# Patient Record
Sex: Female | Born: 1950 | Hispanic: Yes | Marital: Married | Smoking: Never smoker
Health system: Southern US, Community
[De-identification: ages and names within clinical notes are randomized; demographics above are authoritative.]

## PROBLEM LIST (undated history)

## (undated) DIAGNOSIS — F329 Major depressive disorder, single episode, unspecified: Secondary | ICD-10-CM

## (undated) DIAGNOSIS — F32A Depression, unspecified: Secondary | ICD-10-CM

## (undated) DIAGNOSIS — E7800 Pure hypercholesterolemia, unspecified: Secondary | ICD-10-CM

## (undated) DIAGNOSIS — E119 Type 2 diabetes mellitus without complications: Secondary | ICD-10-CM

## (undated) DIAGNOSIS — R42 Dizziness and giddiness: Secondary | ICD-10-CM

## (undated) DIAGNOSIS — E0590 Thyrotoxicosis, unspecified without thyrotoxic crisis or storm: Secondary | ICD-10-CM

## (undated) DIAGNOSIS — R7302 Impaired glucose tolerance (oral): Secondary | ICD-10-CM

## (undated) DIAGNOSIS — I10 Essential (primary) hypertension: Secondary | ICD-10-CM

## (undated) HISTORY — PX: FRACTURE SURGERY: SHX138

## (undated) HISTORY — PX: HERNIA REPAIR: SHX51

---

## 2010-01-14 ENCOUNTER — Ambulatory Visit: Payer: Self-pay | Admitting: Family Medicine

## 2013-10-15 ENCOUNTER — Ambulatory Visit: Payer: Self-pay | Admitting: Family Medicine

## 2015-12-16 ENCOUNTER — Other Ambulatory Visit: Payer: Self-pay | Admitting: Nurse Practitioner

## 2015-12-16 DIAGNOSIS — Z1231 Encounter for screening mammogram for malignant neoplasm of breast: Secondary | ICD-10-CM

## 2015-12-24 ENCOUNTER — Ambulatory Visit
Admission: RE | Admit: 2015-12-24 | Discharge: 2015-12-24 | Disposition: A | Discharge status: Home / Self Care | Payer: BLUE CROSS/BLUE SHIELD | Source: Ambulatory Visit | Attending: Nurse Practitioner | Admitting: Nurse Practitioner

## 2015-12-24 DIAGNOSIS — Z1231 Encounter for screening mammogram for malignant neoplasm of breast: Secondary | ICD-10-CM | POA: Diagnosis present

## 2016-04-05 ENCOUNTER — Encounter: Payer: Self-pay | Admitting: *Deleted

## 2016-04-06 ENCOUNTER — Ambulatory Visit: Payer: BLUE CROSS/BLUE SHIELD | Admitting: Anesthesiology

## 2016-04-06 ENCOUNTER — Encounter: Payer: Self-pay | Admitting: *Deleted

## 2016-04-06 ENCOUNTER — Ambulatory Visit
Admission: RE | Admit: 2016-04-06 | Discharge: 2016-04-06 | Disposition: A | Discharge status: Home / Self Care | Payer: BLUE CROSS/BLUE SHIELD | Source: Ambulatory Visit | Attending: Gastroenterology | Admitting: Gastroenterology

## 2016-04-06 ENCOUNTER — Encounter
Admission: RE | Disposition: A | Discharge status: Home / Self Care | Payer: Self-pay | Source: Ambulatory Visit | Attending: Gastroenterology

## 2016-04-06 DIAGNOSIS — I10 Essential (primary) hypertension: Secondary | ICD-10-CM | POA: Diagnosis not present

## 2016-04-06 DIAGNOSIS — Z1211 Encounter for screening for malignant neoplasm of colon: Secondary | ICD-10-CM | POA: Diagnosis present

## 2016-04-06 DIAGNOSIS — K644 Residual hemorrhoidal skin tags: Secondary | ICD-10-CM | POA: Diagnosis not present

## 2016-04-06 DIAGNOSIS — Z7984 Long term (current) use of oral hypoglycemic drugs: Secondary | ICD-10-CM | POA: Diagnosis not present

## 2016-04-06 DIAGNOSIS — Z7982 Long term (current) use of aspirin: Secondary | ICD-10-CM | POA: Diagnosis not present

## 2016-04-06 DIAGNOSIS — E7800 Pure hypercholesterolemia, unspecified: Secondary | ICD-10-CM | POA: Diagnosis not present

## 2016-04-06 DIAGNOSIS — E0590 Thyrotoxicosis, unspecified without thyrotoxic crisis or storm: Secondary | ICD-10-CM | POA: Diagnosis not present

## 2016-04-06 DIAGNOSIS — K640 First degree hemorrhoids: Secondary | ICD-10-CM | POA: Diagnosis not present

## 2016-04-06 DIAGNOSIS — E119 Type 2 diabetes mellitus without complications: Secondary | ICD-10-CM | POA: Diagnosis not present

## 2016-04-06 HISTORY — DX: Essential (primary) hypertension: I10

## 2016-04-06 HISTORY — DX: Thyrotoxicosis, unspecified without thyrotoxic crisis or storm: E05.90

## 2016-04-06 HISTORY — DX: Depression, unspecified: F32.A

## 2016-04-06 HISTORY — PX: COLONOSCOPY WITH PROPOFOL: SHX5780

## 2016-04-06 HISTORY — DX: Type 2 diabetes mellitus without complications: E11.9

## 2016-04-06 HISTORY — DX: Dizziness and giddiness: R42

## 2016-04-06 HISTORY — DX: Impaired glucose tolerance (oral): R73.02

## 2016-04-06 HISTORY — DX: Major depressive disorder, single episode, unspecified: F32.9

## 2016-04-06 HISTORY — DX: Pure hypercholesterolemia, unspecified: E78.00

## 2016-04-06 LAB — GLUCOSE, CAPILLARY: Glucose-Capillary: 90 mg/dL (ref 65–99)

## 2016-04-06 SURGERY — COLONOSCOPY WITH PROPOFOL
Anesthesia: General

## 2016-04-06 MED ORDER — PROPOFOL 500 MG/50ML IV EMUL
INTRAVENOUS | Status: DC | PRN
  Administered 2016-04-06: 100 ug/kg/min via INTRAVENOUS

## 2016-04-06 MED ORDER — MIDAZOLAM HCL 2 MG/2ML IJ SOLN
INTRAMUSCULAR | Status: DC | PRN
  Administered 2016-04-06: 1 mg via INTRAVENOUS

## 2016-04-06 MED ORDER — PROPOFOL 10 MG/ML IV BOLUS
INTRAVENOUS | Status: DC | PRN
  Administered 2016-04-06: 80 mg via INTRAVENOUS

## 2016-04-06 MED ORDER — SODIUM CHLORIDE 0.9 % IV SOLN
INTRAVENOUS | Status: DC
  Administered 2016-04-06: 10:00:00 via INTRAVENOUS

## 2016-04-06 NOTE — Anesthesia Postprocedure Evaluation (Signed)
Anesthesia Post Note  Patient: Laura Middleton  Procedure(s) Performed: Procedure(s) (LRB): COLONOSCOPY WITH PROPOFOL (N/A)  Patient location during evaluation: Endoscopy Anesthesia Type: General Level of consciousness: awake and alert Pain management: pain level controlled Vital Signs Assessment: post-procedure vital signs reviewed and stable Respiratory status: spontaneous breathing, nonlabored ventilation and respiratory function stable Cardiovascular status: blood pressure returned to baseline and stable Postop Assessment: no signs of nausea or vomiting Anesthetic complications: no    Last Vitals:  Vitals:   04/06/16 0833 04/06/16 1019  BP: 127/76 94/61  Pulse: 65   Resp: 16 16  Temp: 36.4 C     Last Pain:  Vitals:   04/06/16 0833  TempSrc: Tympanic                 Cortney Mckinney

## 2016-04-06 NOTE — Transfer of Care (Signed)
Immediate Anesthesia Transfer of Care Note  Patient: Laura Middleton  Procedure(s) Performed: Procedure(s): COLONOSCOPY WITH PROPOFOL (N/A)  Patient Location: PACU and Endoscopy Unit  Anesthesia Type:General  Level of Consciousness: patient cooperative and lethargic  Airway & Oxygen Therapy: Patient Spontanous Breathing and Patient connected to nasal cannula oxygen  Post-op Assessment: Report given to RN and Post -op Vital signs reviewed and stable  Post vital signs: Reviewed  Last Vitals:  Vitals:   04/06/16 0833 04/06/16 1019  BP: 127/76 94/61  Pulse: 65   Resp: 16 16  Temp: 36.4 C     Last Pain:  Vitals:   04/06/16 0833  TempSrc: Tympanic         Complications: No apparent anesthesia complications

## 2016-04-06 NOTE — H&P (Signed)
Outpatient short stay form Pre-procedure 04/06/2016 9:41 AM Christena DeemMartin U Malissa Slay MD  Primary Physician: Lenon OmsSarah Gauger NP  Reason for visit:  Colon screening  History of present illness:  Patient is a 65 year old female presenting today for colonoscopy. She tolerated her prep well. She does take 81 mg aspirin but is held that for the past 2 days. She does not take any other aspirin products or blood thinning agents.    Current Facility-Administered Medications:  .  0.9 %  sodium chloride infusion, , Intravenous, Continuous, Christena DeemMartin U Jr Milliron, MD  Prescriptions Prior to Admission  Medication Sig Dispense Refill Last Dose  . aspirin EC 81 MG tablet Take 81 mg by mouth daily.   04/04/2016  . calcium-vitamin D 250-100 MG-UNIT tablet Take 1 tablet by mouth 2 (two) times daily.   Past Week at Unknown time  . enalapril (VASOTEC) 10 MG tablet Take 10 mg by mouth daily.   Past Week at Unknown time  . metFORMIN (GLUCOPHAGE-XR) 500 MG 24 hr tablet Take 500 mg by mouth daily after supper.   Past Week at Unknown time  . simvastatin (ZOCOR) 10 MG tablet Take 10 mg by mouth daily.   Past Week at Unknown time     No Known Allergies   Past Medical History:  Diagnosis Date  . Depression   . Diabetes mellitus without complication (HCC)   . Dizziness   . High cholesterol   . Hypertension   . Hyperthyroidism   . IGT (impaired glucose tolerance)     Review of systems:      Physical Exam    Heart and lungs: Regular rate and rhythm without rub or gallop, lungs are bilaterally clear.    HEENT: Normocephalic atraumatic eyes are anicteric    Other:     Pertinant exam for procedure: Soft nontender nondistended bowel sounds positive normoactive.    Planned proceedures: Colonoscopy and indicated procedures. I have discussed the risks benefits and complications of procedures to include not limited to bleeding, infection, perforation and the risk of sedation and the patient wishes to  proceed.    Christena DeemMartin U Alexzander Dolinger, MD Gastroenterology 04/06/2016  9:41 AM

## 2016-04-06 NOTE — Anesthesia Preprocedure Evaluation (Addendum)
Anesthesia Evaluation  Patient identified by MRN, date of birth, ID band Patient awake    Reviewed: Allergy & Precautions, NPO status , Patient's Chart, lab work & pertinent test results  History of Anesthesia Complications Negative for: history of anesthetic complications  Airway Mallampati: II  TM Distance: >3 FB Neck ROM: Full    Dental  (+) Poor Dentition, Loose,    Pulmonary neg pulmonary ROS, neg sleep apnea, neg COPD,    breath sounds clear to auscultation- rhonchi (-) wheezing      Cardiovascular hypertension, Pt. on medications (-) CAD and (-) Past MI  Rhythm:Regular Rate:Normal - Systolic murmurs and - Diastolic murmurs    Neuro/Psych Depression negative neurological ROS     GI/Hepatic negative GI ROS, Neg liver ROS,   Endo/Other  diabetes, Type 2, Oral Hypoglycemic Agents  Renal/GU negative Renal ROS     Musculoskeletal negative musculoskeletal ROS (+)   Abdominal (+) + obese,   Peds  Hematology negative hematology ROS (+)   Anesthesia Other Findings Past Medical History: No date: Depression No date: Diabetes mellitus without complication (HCC) No date: Dizziness No date: High cholesterol No date: Hypertension No date: Hyperthyroidism No date: IGT (impaired glucose tolerance)   Reproductive/Obstetrics                            Anesthesia Physical Anesthesia Plan  ASA: II  Anesthesia Plan: General   Post-op Pain Management:    Induction: Intravenous  Airway Management Planned: Natural Airway  Additional Equipment:   Intra-op Plan:   Post-operative Plan:   Informed Consent: I have reviewed the patients History and Physical, chart, labs and discussed the procedure including the risks, benefits and alternatives for the proposed anesthesia with the patient or authorized representative who has indicated his/her understanding and acceptance.   Dental advisory  given  Plan Discussed with: CRNA and Anesthesiologist  Anesthesia Plan Comments:         Anesthesia Quick Evaluation

## 2016-04-06 NOTE — Op Note (Signed)
[facility] Gastroenterology Patient Name: Laura Middleton Procedure Date: 04/06/2016 9:51 AM MRN: [id number] Account #: [id number] Date of Birth: 09/11/1950 Admit Type: Outpatient Age: [age] Room[facility] ENDO ROOM 3 Gender: Female Note Status: Finalized Procedure:            Colonoscopy Indications:          Screening for colorectal malignant neoplasm Providers:            Christena DeemMartin U. Millie Shorb, MD Referring MD:         No Local Md, MD (Referring MD) Medicines:            Monitored Anesthesia Care Complications:        No immediate complications. Procedure:            Pre-Anesthesia Assessment:                       - ASA Grade Assessment: II - A patient with mild                        systemic disease.                       After obtaining informed consent, the colonoscope was                        passed under direct vision. Throughout the procedure,                        the patient's blood pressure, pulse, and oxygen                        saturations were monitored continuously. The                        Colonoscope was introduced through the anus and                        advanced to the the cecum, identified by appendiceal                        orifice and ileocecal valve. The quality of the bowel                        preparation was good. The colonoscopy was performed                        without difficulty. The patient tolerated the procedure                        well. Findings:      The colon (entire examined portion) appeared normal.      Non-bleeding internal hemorrhoids were found during retroflexion. The       hemorrhoids were small and Grade I (internal hemorrhoids that do not       prolapse).      The perianal exam findings include skin tags. Impression:           - The entire examined colon is normal.                       - Non-bleeding internal hemorrhoids.                       -  Perianal skin tags found on perianal exam.                - No specimens collected. Recommendation:       - Repeat colonoscopy in 10 years for screening purposes. Procedure Code(s):    --- Professional ---                       [id number], Colonoscopy, flexible; diagnostic, including                        collection of specimen(s) by brushing or washing, when                        performed (separate procedure) Diagnosis Code(s):    --- Professional ---                       Z12.11, Encounter for screening for malignant neoplasm                        of colon                       K64.0, First degree hemorrhoids                       K64.4, Residual hemorrhoidal skin tags CPT copyright 2016 [employer]. All rights reserved. The codes documented in this report are preliminary and upon coder review may  be revised to meet current compliance requirements. Christena DeemMartin U Nuri Branca, MD 04/06/2016 10:19:09 AM This report has been signed electronically. Number of Addenda: 0 Note Initiated On: 04/06/2016 9:51 AM Scope Withdrawal Time: 0 hours 7 minutes 12 seconds  Total Procedure Duration: 0 hours 14 minutes 12 seconds       [facility]

## 2016-04-07 ENCOUNTER — Encounter: Payer: Self-pay | Admitting: Gastroenterology

## 2016-12-01 ENCOUNTER — Other Ambulatory Visit: Payer: Self-pay | Admitting: Nurse Practitioner

## 2016-12-01 DIAGNOSIS — Z1231 Encounter for screening mammogram for malignant neoplasm of breast: Secondary | ICD-10-CM

## 2017-01-04 ENCOUNTER — Encounter: Payer: Self-pay | Admitting: Radiology

## 2017-01-04 ENCOUNTER — Ambulatory Visit
Admission: RE | Admit: 2017-01-04 | Discharge: 2017-01-04 | Disposition: A | Discharge status: Home / Self Care | Payer: BLUE CROSS/BLUE SHIELD | Source: Ambulatory Visit | Attending: Nurse Practitioner | Admitting: Nurse Practitioner

## 2017-01-04 DIAGNOSIS — Z1231 Encounter for screening mammogram for malignant neoplasm of breast: Secondary | ICD-10-CM

## 2018-01-11 ENCOUNTER — Other Ambulatory Visit: Payer: Self-pay | Admitting: Nurse Practitioner

## 2018-01-11 DIAGNOSIS — Z1231 Encounter for screening mammogram for malignant neoplasm of breast: Secondary | ICD-10-CM

## 2018-01-24 ENCOUNTER — Ambulatory Visit
Admission: RE | Admit: 2018-01-24 | Discharge: 2018-01-24 | Disposition: A | Discharge status: Home / Self Care | Payer: BLUE CROSS/BLUE SHIELD | Source: Ambulatory Visit | Attending: Nurse Practitioner | Admitting: Nurse Practitioner

## 2018-01-24 ENCOUNTER — Encounter (INDEPENDENT_AMBULATORY_CARE_PROVIDER_SITE_OTHER): Payer: Self-pay

## 2018-01-24 DIAGNOSIS — Z1231 Encounter for screening mammogram for malignant neoplasm of breast: Secondary | ICD-10-CM | POA: Diagnosis present

## 2018-10-24 ENCOUNTER — Ambulatory Visit
Admission: EM | Admit: 2018-10-24 | Discharge: 2018-10-24 | Disposition: A | Discharge status: Home / Self Care | Payer: BLUE CROSS/BLUE SHIELD | Attending: Family Medicine | Admitting: Family Medicine

## 2018-10-24 ENCOUNTER — Other Ambulatory Visit: Payer: Self-pay

## 2018-10-24 DIAGNOSIS — R51 Headache: Secondary | ICD-10-CM

## 2018-10-24 DIAGNOSIS — J029 Acute pharyngitis, unspecified: Secondary | ICD-10-CM

## 2018-10-24 DIAGNOSIS — R05 Cough: Secondary | ICD-10-CM

## 2018-10-24 DIAGNOSIS — B9789 Other viral agents as the cause of diseases classified elsewhere: Secondary | ICD-10-CM

## 2018-10-24 DIAGNOSIS — J069 Acute upper respiratory infection, unspecified: Secondary | ICD-10-CM | POA: Diagnosis not present

## 2018-10-24 LAB — RAPID INFLUENZA A&B ANTIGENS (ARMC ONLY): INFLUENZA A (ARMC): NEGATIVE

## 2018-10-24 LAB — RAPID INFLUENZA A&B ANTIGENS: Influenza B (ARMC): NEGATIVE

## 2018-10-24 MED ORDER — NAPROXEN 500 MG PO TABS: 500.0000 mg | ORAL_TABLET | Freq: Two times a day (BID) | ORAL | 0 refills | Status: DC | PRN

## 2018-10-24 MED ORDER — NAPROXEN 500 MG PO TABS: 500.0000 mg | ORAL_TABLET | Freq: Two times a day (BID) | ORAL | 0 refills | Status: AC | PRN

## 2018-10-24 MED ORDER — BENZONATATE 200 MG PO CAPS: 200.0000 mg | ORAL_CAPSULE | Freq: Three times a day (TID) | ORAL | 0 refills | Status: DC | PRN

## 2018-10-24 MED ORDER — BENZONATATE 200 MG PO CAPS: 200.0000 mg | ORAL_CAPSULE | Freq: Three times a day (TID) | ORAL | 0 refills | Status: AC | PRN
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
## 2018-10-24 NOTE — ED Triage Notes (Signed)
Patient granddaughter translates that patient has been experiencing a cough since yesterday with congestion.

## 2018-10-24 NOTE — Discharge Instructions (Signed)
Flu negative.  This is viral.  Medications as prescribed.  Take care  Dr. Adriana Simas

## 2018-12-19 ENCOUNTER — Other Ambulatory Visit: Payer: Self-pay | Admitting: Nurse Practitioner

## 2018-12-19 DIAGNOSIS — Z1231 Encounter for screening mammogram for malignant neoplasm of breast: Secondary | ICD-10-CM

## 2019-12-17 ENCOUNTER — Other Ambulatory Visit: Payer: Self-pay | Admitting: Nurse Practitioner

## 2019-12-17 DIAGNOSIS — Z1231 Encounter for screening mammogram for malignant neoplasm of breast: Secondary | ICD-10-CM

## 2020-01-20 ENCOUNTER — Ambulatory Visit
Admission: RE | Admit: 2020-01-20 | Discharge: 2020-01-20 | Disposition: A | Discharge status: Home / Self Care | Payer: BLUE CROSS/BLUE SHIELD | Source: Ambulatory Visit | Attending: Nurse Practitioner | Admitting: Nurse Practitioner

## 2020-01-20 ENCOUNTER — Other Ambulatory Visit: Payer: Self-pay

## 2020-01-20 DIAGNOSIS — Z1231 Encounter for screening mammogram for malignant neoplasm of breast: Secondary | ICD-10-CM

## 2021-04-21 IMAGING — MG DIGITAL SCREENING BILAT W/ TOMO W/ CAD
8 series · 8 of 24 positions shown · non-contrast
Comparison: Previous exam(s).

CLINICAL DATA: Screening.

EXAM:
DIGITAL SCREENING BILATERAL MAMMOGRAM WITH TOMO AND CAD

[L MLO synth-2D]
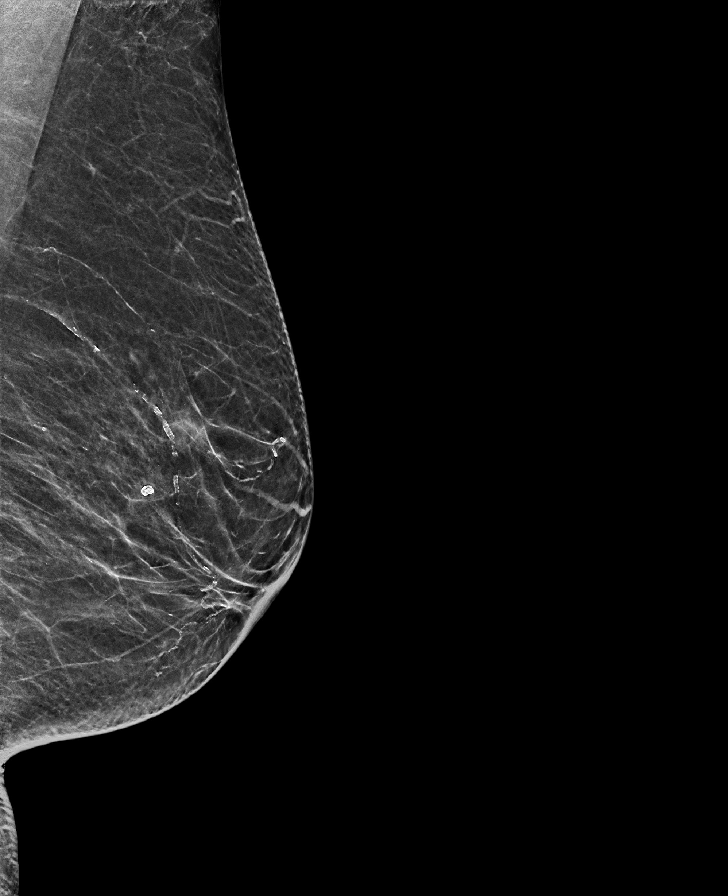

[L CC synth-2D]
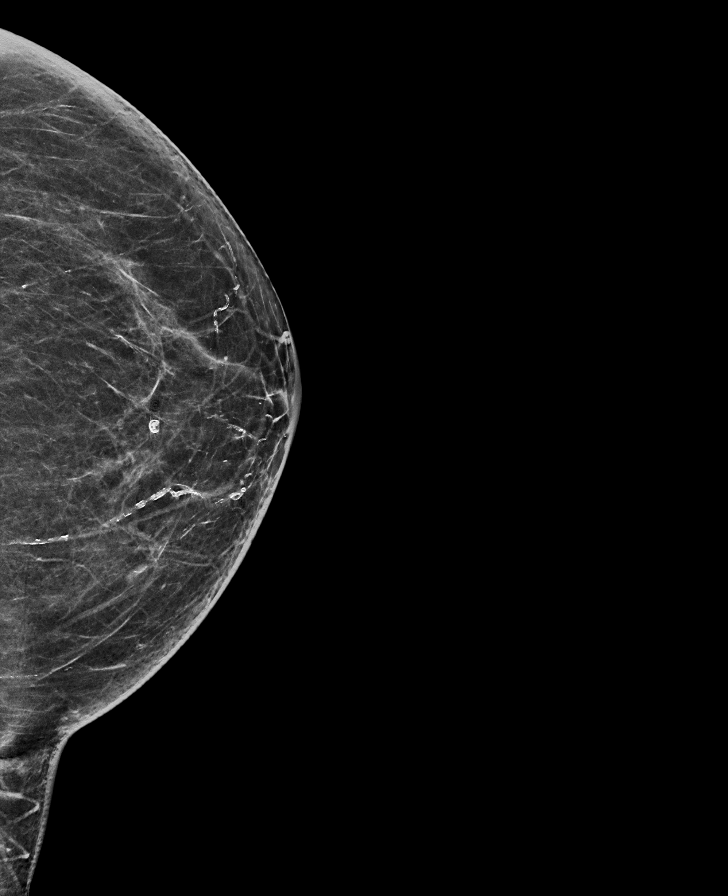

[R CC synth-2D]
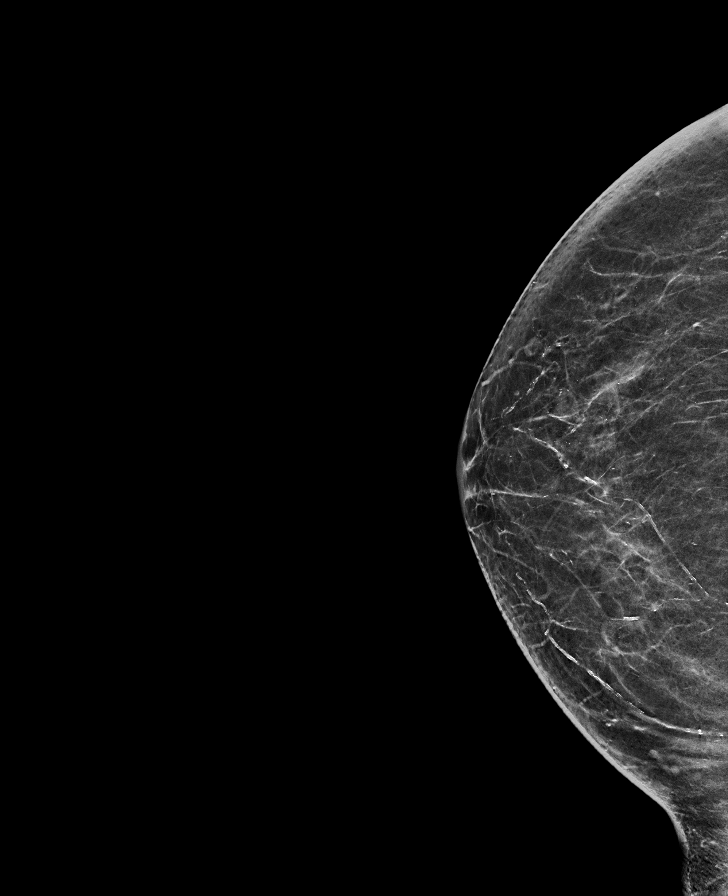

[R MLO synth-2D]
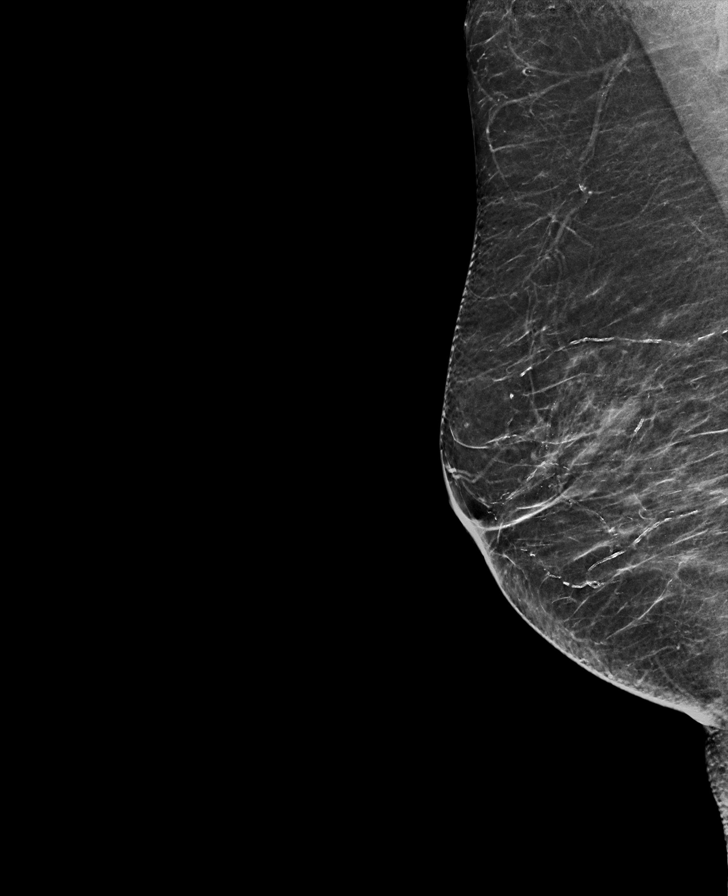

[L CC tomo · tomo slice 33/64.0]
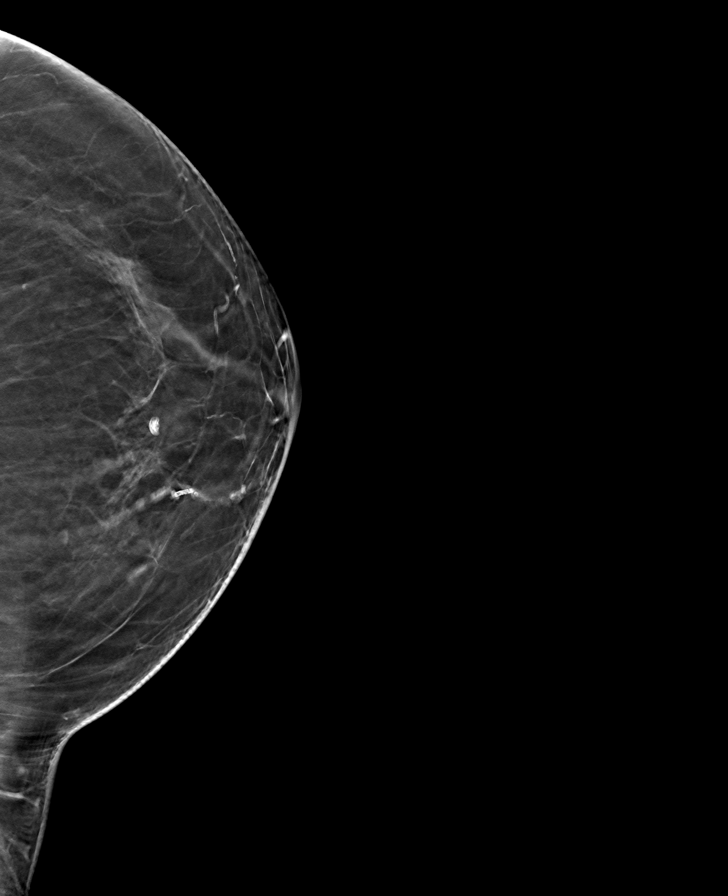

[R MLO tomo · tomo slice 32/63.0]
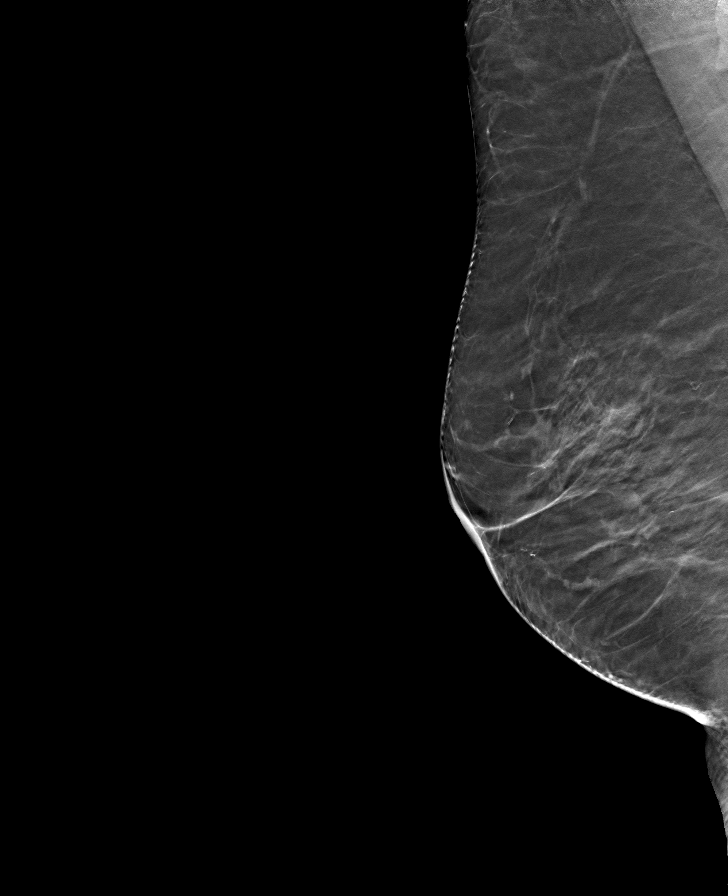

[R CC tomo · tomo slice 33/64.0]
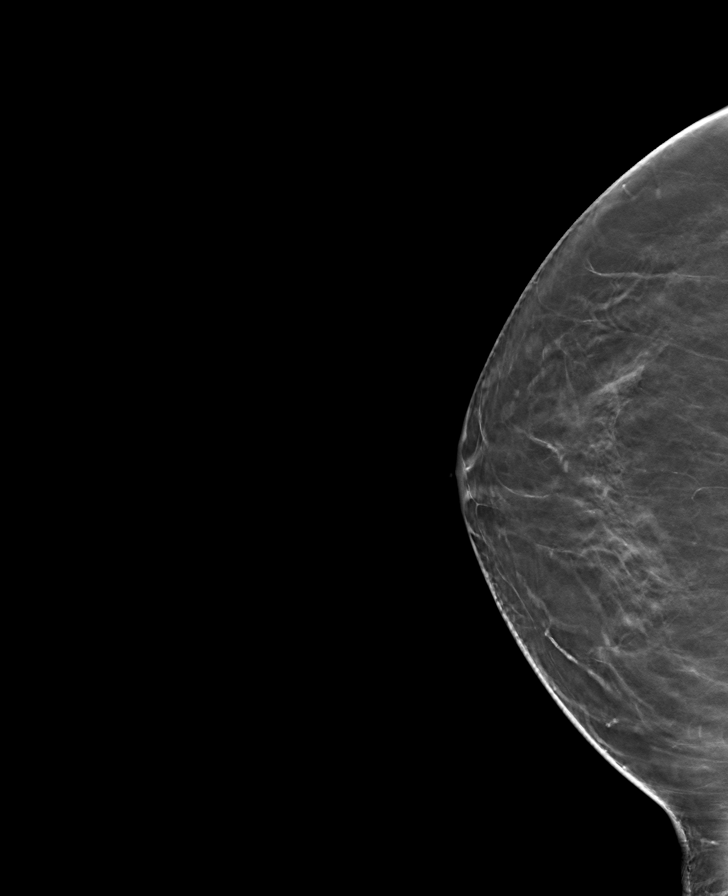

[L MLO tomo · tomo slice 31/61.0]
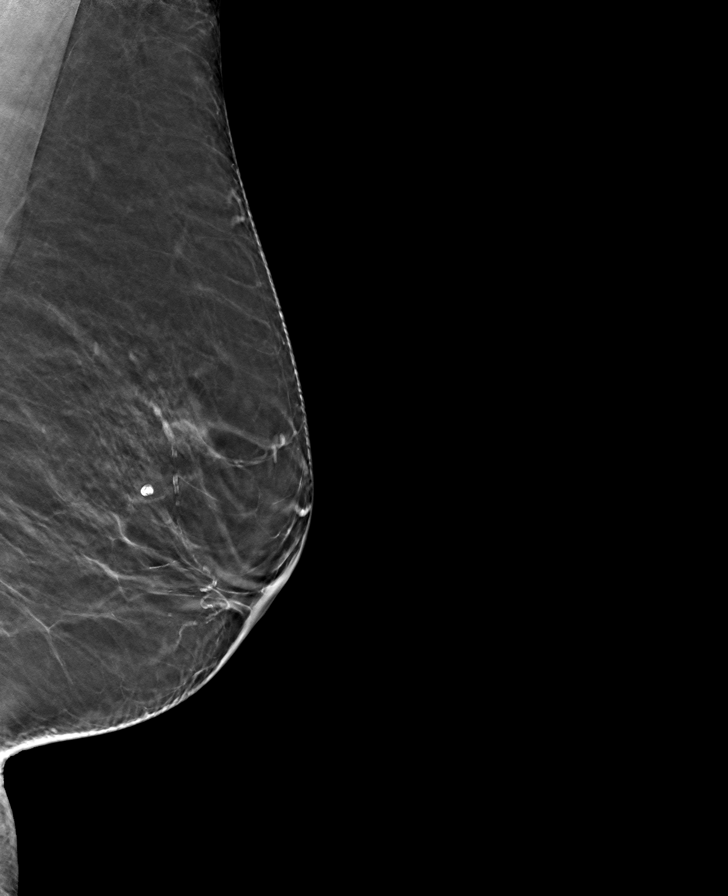

[8 of 24 positions shown; findings below may reference images not displayed]

ACR Breast Density Category b: There are scattered areas of
fibroglandular density.
FINDINGS: There are no findings suspicious for malignancy. Images were
processed with CAD.
IMPRESSION: No mammographic evidence of malignancy. A result letter of this
screening mammogram will be mailed directly to the patient.

RECOMMENDATION:
Screening mammogram in one year. (Code:CN-U-775)

BI-RADS CATEGORY  1: Negative.

## 2021-10-22 DIAGNOSIS — E038 Other specified hypothyroidism: Secondary | ICD-10-CM | POA: Diagnosis not present

## 2021-10-22 DIAGNOSIS — E119 Type 2 diabetes mellitus without complications: Secondary | ICD-10-CM | POA: Diagnosis not present

## 2021-10-22 DIAGNOSIS — E782 Mixed hyperlipidemia: Secondary | ICD-10-CM | POA: Diagnosis not present

## 2021-10-22 DIAGNOSIS — I10 Essential (primary) hypertension: Secondary | ICD-10-CM | POA: Diagnosis not present

## 2021-10-22 DIAGNOSIS — Z79899 Other long term (current) drug therapy: Secondary | ICD-10-CM | POA: Diagnosis not present

## 2021-10-22 DIAGNOSIS — Z23 Encounter for immunization: Secondary | ICD-10-CM | POA: Diagnosis not present

## 2021-10-26 DIAGNOSIS — M7062 Trochanteric bursitis, left hip: Secondary | ICD-10-CM | POA: Diagnosis not present

## 2021-10-26 DIAGNOSIS — M25552 Pain in left hip: Secondary | ICD-10-CM | POA: Diagnosis not present

## 2021-11-09 DIAGNOSIS — M7062 Trochanteric bursitis, left hip: Secondary | ICD-10-CM | POA: Diagnosis not present

## 2021-11-09 DIAGNOSIS — M1652 Unilateral post-traumatic osteoarthritis, left hip: Secondary | ICD-10-CM | POA: Diagnosis not present

## 2022-06-02 ENCOUNTER — Other Ambulatory Visit: Payer: Self-pay | Admitting: Nurse Practitioner

## 2022-06-02 DIAGNOSIS — Z1231 Encounter for screening mammogram for malignant neoplasm of breast: Secondary | ICD-10-CM

## 2022-06-02 DIAGNOSIS — I10 Essential (primary) hypertension: Secondary | ICD-10-CM | POA: Diagnosis not present

## 2022-06-02 DIAGNOSIS — Z23 Encounter for immunization: Secondary | ICD-10-CM | POA: Diagnosis not present

## 2022-06-02 DIAGNOSIS — E119 Type 2 diabetes mellitus without complications: Secondary | ICD-10-CM | POA: Diagnosis not present

## 2022-06-02 DIAGNOSIS — Z1331 Encounter for screening for depression: Secondary | ICD-10-CM | POA: Diagnosis not present

## 2022-06-02 DIAGNOSIS — R413 Other amnesia: Secondary | ICD-10-CM | POA: Diagnosis not present

## 2022-06-02 DIAGNOSIS — Z79899 Other long term (current) drug therapy: Secondary | ICD-10-CM | POA: Diagnosis not present

## 2022-06-02 DIAGNOSIS — H9193 Unspecified hearing loss, bilateral: Secondary | ICD-10-CM | POA: Diagnosis not present

## 2022-06-02 DIAGNOSIS — E782 Mixed hyperlipidemia: Secondary | ICD-10-CM | POA: Diagnosis not present

## 2022-06-02 DIAGNOSIS — E038 Other specified hypothyroidism: Secondary | ICD-10-CM | POA: Diagnosis not present

## 2022-06-02 DIAGNOSIS — Z0000 Encounter for general adult medical examination without abnormal findings: Secondary | ICD-10-CM | POA: Diagnosis not present

## 2022-06-23 ENCOUNTER — Ambulatory Visit
Admission: RE | Admit: 2022-06-23 | Discharge: 2022-06-23 | Disposition: A | Discharge status: Home / Self Care | Payer: 59 | Source: Ambulatory Visit | Attending: Nurse Practitioner | Admitting: Nurse Practitioner

## 2022-06-23 DIAGNOSIS — Z1231 Encounter for screening mammogram for malignant neoplasm of breast: Secondary | ICD-10-CM | POA: Diagnosis not present

## 2023-04-03 DIAGNOSIS — Z1331 Encounter for screening for depression: Secondary | ICD-10-CM | POA: Diagnosis not present

## 2023-04-03 DIAGNOSIS — E782 Mixed hyperlipidemia: Secondary | ICD-10-CM | POA: Diagnosis not present

## 2023-04-03 DIAGNOSIS — I10 Essential (primary) hypertension: Secondary | ICD-10-CM | POA: Diagnosis not present

## 2023-04-03 DIAGNOSIS — Z79899 Other long term (current) drug therapy: Secondary | ICD-10-CM | POA: Diagnosis not present

## 2023-04-03 DIAGNOSIS — R413 Other amnesia: Secondary | ICD-10-CM | POA: Diagnosis not present

## 2023-04-03 DIAGNOSIS — E119 Type 2 diabetes mellitus without complications: Secondary | ICD-10-CM | POA: Diagnosis not present

## 2023-04-03 DIAGNOSIS — E038 Other specified hypothyroidism: Secondary | ICD-10-CM | POA: Diagnosis not present

## 2023-05-23 ENCOUNTER — Other Ambulatory Visit: Payer: Self-pay | Admitting: Nurse Practitioner

## 2023-05-23 DIAGNOSIS — Z1231 Encounter for screening mammogram for malignant neoplasm of breast: Secondary | ICD-10-CM

## 2023-05-26 ENCOUNTER — Other Ambulatory Visit: Payer: Self-pay | Admitting: Physician Assistant

## 2023-05-26 DIAGNOSIS — R413 Other amnesia: Secondary | ICD-10-CM

## 2023-06-02 ENCOUNTER — Encounter: Payer: Self-pay | Admitting: Physician Assistant

## 2023-06-05 ENCOUNTER — Ambulatory Visit
Admission: RE | Admit: 2023-06-05 | Discharge: 2023-06-05 | Disposition: A | Discharge status: Home / Self Care | Payer: 59 | Source: Ambulatory Visit | Attending: Physician Assistant | Admitting: Physician Assistant

## 2023-06-05 DIAGNOSIS — R413 Other amnesia: Secondary | ICD-10-CM

## 2023-12-19 ENCOUNTER — Other Ambulatory Visit: Payer: Self-pay | Admitting: Nurse Practitioner

## 2023-12-19 DIAGNOSIS — Z1231 Encounter for screening mammogram for malignant neoplasm of breast: Secondary | ICD-10-CM

## 2024-01-10 ENCOUNTER — Ambulatory Visit

## 2024-01-25 ENCOUNTER — Ambulatory Visit
Admission: RE | Admit: 2024-01-25 | Discharge: 2024-01-25 | Disposition: A | Discharge status: Home / Self Care | Source: Ambulatory Visit | Attending: Nurse Practitioner | Admitting: Nurse Practitioner

## 2024-01-25 DIAGNOSIS — Z1231 Encounter for screening mammogram for malignant neoplasm of breast: Secondary | ICD-10-CM | POA: Diagnosis present
